# Patient Record
Sex: Male | Born: 1970 | Race: Black or African American | Hispanic: No | Marital: Married | Smoking: Current some day smoker
Health system: Southern US, Community
[De-identification: ages and names within clinical notes are randomized; demographics above are authoritative.]

## PROBLEM LIST (undated history)

## (undated) HISTORY — PX: NO PAST SURGERIES: SHX2092

---

## 2018-04-02 ENCOUNTER — Other Ambulatory Visit: Payer: Self-pay

## 2018-04-02 ENCOUNTER — Ambulatory Visit (INDEPENDENT_AMBULATORY_CARE_PROVIDER_SITE_OTHER): Payer: Medicaid Other | Admitting: Family Medicine

## 2018-04-02 DIAGNOSIS — Z789 Other specified health status: Secondary | ICD-10-CM | POA: Diagnosis not present

## 2018-04-02 DIAGNOSIS — Z0289 Encounter for other administrative examinations: Secondary | ICD-10-CM | POA: Diagnosis present

## 2018-04-02 NOTE — Patient Instructions (Signed)
It was very good to meet you today.  I will get the results from the Health Department.

## 2018-04-04 ENCOUNTER — Encounter: Payer: Self-pay | Admitting: Family Medicine

## 2018-04-04 DIAGNOSIS — Z789 Other specified health status: Secondary | ICD-10-CM | POA: Insufficient documentation

## 2018-04-04 DIAGNOSIS — Z0289 Encounter for other administrative examinations: Secondary | ICD-10-CM | POA: Insufficient documentation

## 2018-04-04 DIAGNOSIS — Z758 Other problems related to medical facilities and other health care: Secondary | ICD-10-CM | POA: Insufficient documentation

## 2018-04-04 NOTE — Assessment & Plan Note (Signed)
Doing well with transition.  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  No complaints or concerns today.  Awaiting lab work from [employer].

## 2018-04-04 NOTE — Progress Notes (Signed)
[place] interpreter Boykin Reaper [phone] utilized during today's visit.  [facility] New [facility] Visit  HPI:  Patient presents to [facility] today for a new patient appointment to establish general primary care.  He has no concerns or complaints.  ROS: The patient denies fever, unusual weight change, decreased hearing, chest pain, palpitations, pre-syncopal or syncopal episodes, dyspnea on exertion, prolonged cough, hemoptysis, change in bowel habits, melena, hematochezia, severe indigestion/heartburn, nausea/vomiting/abdominal pain, genital sores, muscle weakness, difficulty walking, abnormal bleeding, or enlarged lymph nodes.    Past Medical Hx:  -No hospitalizations or chronic medical conditions that he knows of  Past Surgical Hx:  -denies  Family Hx: updated in Epic - Wife died while in [place].  He is here by himself.  Denies any family history of chronic medical conditions.    Immigrant Social History: - Date arrived in [place]: February 27, 2018 - Country of origin: [place] - Location of refugee camp (if applicable), how long there, and what caused patient to leave home country?: *intepreter could not understand what country he spent time in.  Stated he went to [place] (old name for [place]) and then lapsed into [place] when talking about where he went next.   - Primary language: [place] -- though some difficulties with translation  -Requires intepreter (essentially speaks no [place]) - Prior work: Water engineer work - Designer, fashion/clothing use: denies  - Marriage Status: widower  Preventative Care History: -Seen at health department?: yes  PHYSICAL EXAM: BP 124/66   Pulse (!) 58   Temp 98.1 F (36.7 C) (Oral)   Ht 5' 9.5" (1.765 m)   Wt 155 lb (70.3 kg)   SpO2 98%   BMI 22.56 kg/m  Gen:  Alert, cooperative patient who appears stated age in no acute distress.  Vital signs reviewed. Head: Harper/AT.   Eyes:  EOMI, PERRL.   Ears:  External ears WNL, Bilateral TM's normal without  retraction, redness or bulging. Nose:  Septum midline  Mouth:  MMM, tonsils non-erythematous, non-edematous.   Neck: No masses or thyromegaly or limitation in range of motion.  No cervical lymphadenopathy. Cardiac:  Regular rate and rhythm without murmur auscultated.  Good S1/S2. Pulm:  Clear to auscultation bilaterally with good air movement.  No wheezes or rales noted.   Abd:  Soft/nondistended/nontender.  Good bowel sounds throughout all four quadrants.  No masses noted.  Ext:  No clubbing/cyanosis/erythema.  No edema noted bilateral lower extremities.   Neuro:  Alert and oriented to person, place, and date.  No focal deficits noted.   Psych:  Not depressed or anxious appearing.  Linear and coherent thought process as evidenced by speech pattern. Smiles spontaneously.

## 2018-04-04 NOTE — Assessment & Plan Note (Addendum)
[place] intepreter used today.  As he reports being from [place], main language is [place].  He lapsed into [place] multiple times while speaking with [place] interpreter, making interpretation difficult.  He was apparently unaware he was doing this.

## 2018-06-19 ENCOUNTER — Other Ambulatory Visit: Payer: Self-pay

## 2018-06-19 ENCOUNTER — Encounter (HOSPITAL_COMMUNITY): Payer: Self-pay | Admitting: Emergency Medicine

## 2018-06-19 ENCOUNTER — Ambulatory Visit (HOSPITAL_COMMUNITY)
Admission: EM | Admit: 2018-06-19 | Discharge: 2018-06-19 | Disposition: A | Discharge status: Home / Self Care | Payer: Medicaid Other | Attending: Family Medicine | Admitting: Family Medicine

## 2018-06-19 ENCOUNTER — Ambulatory Visit (INDEPENDENT_AMBULATORY_CARE_PROVIDER_SITE_OTHER): Payer: Medicaid Other

## 2018-06-19 DIAGNOSIS — S8251XA Displaced fracture of medial malleolus of right tibia, initial encounter for closed fracture: Secondary | ICD-10-CM | POA: Diagnosis not present

## 2018-06-19 MED ORDER — HYDROCODONE-ACETAMINOPHEN 5-325 MG PO TABS
1.0000 | ORAL_TABLET | Freq: Once | ORAL | Status: AC
  Administered 2018-06-19: 1 via ORAL

## 2018-06-19 MED ORDER — HYDROCODONE-ACETAMINOPHEN 5-325 MG PO TABS
ORAL_TABLET | ORAL | Status: AC
  Filled 2018-06-19: qty 1

## 2018-06-19 MED ORDER — HYDROCODONE-ACETAMINOPHEN 5-325 MG PO TABS: 1.0000 | ORAL_TABLET | ORAL | 0 refills | Status: DC | PRN

## 2018-06-19 NOTE — Progress Notes (Signed)
Orthopedic Tech Progress Note Patient Details:  Linus MakoBenoit Lasky 06/11/1971 [id number]  Ortho Devices Type of Ortho Device: Ace wrap, Post (short leg) splint Ortho Device/Splint Location: rle Ortho Device/Splint Interventions: Application   Post Interventions Patient Tolerated: Well Instructions Provided: Care of device   Nikki DomCrawford, Reegan Bouffard 06/19/2018, 6:57 PM

## 2018-06-19 NOTE — ED Notes (Signed)
Ortho tech notified.  

## 2018-06-19 NOTE — ED Triage Notes (Signed)
Larey SeatFell climbing steps 3 days ago. Right foot is painful and swollen.  Able to move toes, pedal pulse 2 +.  Points to inner ankle as area of pain

## 2018-06-19 NOTE — Discharge Instructions (Addendum)
You have a fracture in your ankle.  We are placing a splint on your leg.  You will need to follow up with orthopedic within the next week.

## 2018-06-20 NOTE — ED Provider Notes (Signed)
[facility]    CSN: [id number] Arrival date & time: 06/19/18  1610     History   Chief Complaint Chief Complaint  Patient presents with  . Fall    HPI Adam MakoBenoit Garrett is a 47 y.o. male.   Pt is a 47 year old male that present with right ankle pain, foot pain and swelling after a fall that occurred 3 days ago. He reports he was going down some steps and stumbled landing on the right leg and foot. Since he has had increased pain and swelling and unable to ambulate on the foot. Reports some numbness and tingling at times.    All of this information was obtained from translator.      History reviewed. No pertinent past medical history.  Patient Active Problem List   Diagnosis Date Noted  . Refugee health examination 04/04/2018  . Language barrier 04/04/2018    History reviewed. No pertinent surgical history.     Home Medications    Prior to Admission medications   Medication Sig Start Date End Date Taking? Authorizing Provider  HYDROcodone-acetaminophen (NORCO/VICODIN) 5-325 MG tablet Take 1 tablet by mouth every 4 (four) hours as needed. 06/19/18   Janace ArisBast, Audrick Lamoureaux A, NP    Family History Family History  Family history unknown: Yes    Social History Social History   Tobacco Use  . Smoking status: Former Games developermoker  . Smokeless tobacco: Never Used  Substance Use Topics  . Alcohol use: Yes  . Drug use: Never     Allergies   Patient has no known allergies.   Review of Systems Review of Systems  Constitutional: Positive for activity change.  Cardiovascular: Negative for leg swelling.  Musculoskeletal: Positive for joint swelling.  Neurological: Negative for numbness.  Hematological: Does not bruise/bleed easily.     Physical Exam Triage Vital Signs ED Triage Vitals  Enc Vitals Group     BP 06/19/18 1708 (!) 151/81     Pulse Rate 06/19/18 1708 70     Resp 06/19/18 1708 18     Temp 06/19/18 1708 98.5 F (36.9 C)     Temp Source 06/19/18  1708 Oral     SpO2 06/19/18 1708 98 %     Weight --      Height --      Head Circumference --      Peak Flow --      Pain Score 06/19/18 1714 8     Pain Loc --      Pain Edu? --      Excl. in GC? --    No data found.  Updated Vital Signs BP (!) 151/81 (BP Location: Left Arm)   Pulse 70   Temp 98.5 F (36.9 C) (Oral)   Resp 18   SpO2 98%   Visual Acuity Right Eye Distance:   Left Eye Distance:   Bilateral Distance:    Right Eye Near:   Left Eye Near:    Bilateral Near:     Physical Exam  Pulmonary/Chest: Effort normal.  Musculoskeletal: Normal range of motion.  Significant swelling and tenderness to the right ankle and foot. 2+ pedal pulse and sensation intact. Most tender around the medial malleolus.   Very limited ROM with ankle. Able to wiggle toes. No weight bearing.   Neurological: He is alert.  Skin: Skin is warm. Capillary refill takes less than 2 seconds.  Psychiatric: He has a normal mood and affect.  Nursing note and vitals reviewed.  UC Treatments / Results  Labs (all labs ordered are listed, but only abnormal results are displayed) Labs Reviewed - No data to display  EKG None  Radiology Dg Ankle Complete Right  Result Date: 06/19/2018 CLINICAL DATA:  Larey Seat while climbing stairs 3 days ago, persistent RIGHT ankle pain and swelling. Initial encounter. EXAM: RIGHT ANKLE - COMPLETE 3+ VIEW COMPARISON:  None. FINDINGS: Acute comminuted mildly displaced fracture involving the MEDIAL malleolus with extension to the articular surface. No other fractures. Ankle mortise intact with well-preserved joint space. Bone mineral density well-preserved. Large joint effusion/hemarthrosis. IMPRESSION: Acute comminuted mildly displaced intra-articular fracture involving the MEDIAL malleolus. Electronically Signed   By: Hulan Saas M.D.   On: 06/19/2018 18:28    Procedures Procedures (including critical care time)  Medications Ordered in UC Medications    HYDROcodone-acetaminophen (NORCO/VICODIN) 5-325 MG per tablet 1 tablet (1 tablet Oral Given 06/19/18 1907)    Initial Impression / Assessment and Plan / UC Course  I have reviewed the triage vital signs and the nursing notes.  Pertinent labs & imaging results that were available during my care of the patient were reviewed by me and considered in my medical decision making (see chart for details).     Pt xray positive for medial malleolus fracture that is mildly displaced. Will place pt in a short leg posterior split with crutches and have him follow up with ortho. Went over this with patient using the translator and he agreed. hydrocodone for pain.  Final Clinical Impressions(s) / UC Diagnoses   Final diagnoses:  Displaced fracture of medial malleolus of right tibia, initial encounter for closed fracture     Discharge Instructions     You have a fracture in your ankle.  We are placing a splint on your leg.  You will need to follow up with orthopedic within the next week.      ED Prescriptions    Medication Sig Dispense Auth. Provider   HYDROcodone-acetaminophen (NORCO/VICODIN) 5-325 MG tablet Take 1 tablet by mouth every 4 (four) hours as needed. 12 tablet Dahlia Byes A, NP     Controlled Substance Prescriptions [place] Controlled Substance Registry consulted? yes   Janace Aris, NP 06/20/18 1014

## 2018-06-26 ENCOUNTER — Ambulatory Visit (INDEPENDENT_AMBULATORY_CARE_PROVIDER_SITE_OTHER): Payer: Medicaid Other | Admitting: Family

## 2018-06-26 ENCOUNTER — Encounter (INDEPENDENT_AMBULATORY_CARE_PROVIDER_SITE_OTHER): Payer: Self-pay | Admitting: Family

## 2018-06-26 VITALS — Ht 69.5 in | Wt 155.0 lb

## 2018-06-26 DIAGNOSIS — S8251XD Displaced fracture of medial malleolus of right tibia, subsequent encounter for closed fracture with routine healing: Secondary | ICD-10-CM

## 2018-06-28 ENCOUNTER — Encounter (INDEPENDENT_AMBULATORY_CARE_PROVIDER_SITE_OTHER): Payer: Self-pay | Admitting: Family

## 2018-06-28 ENCOUNTER — Ambulatory Visit (INDEPENDENT_AMBULATORY_CARE_PROVIDER_SITE_OTHER): Payer: Self-pay | Admitting: Physician Assistant

## 2018-06-28 DIAGNOSIS — S8251XA Displaced fracture of medial malleolus of right tibia, initial encounter for closed fracture: Secondary | ICD-10-CM | POA: Insufficient documentation

## 2018-06-28 NOTE — Progress Notes (Signed)
   Office Visit Note   Patient: Adam Garrett           Date of Birth: 07/29/1971           MRN: [id number] Visit Date: 06/26/2018              Requested by: Tobey GrimWalden, Jeffrey H, MD [address] [place], [place] [address] PCP: Tobey GrimWalden, Jeffrey H, MD  Chief Complaint  Patient presents with  . Right Ankle - Fracture      HPI: The patient is a 47 year old gentleman who presents today for initial evaluation of a right ankle fracture.  Was initially seen in urgent care for the same.  Today is in a splint.  Does not speak English but does have an interpreter accompanying the visit.  Reports through the interpreter he has been weightbearing on the right with his toes.  Complains of minimal pain has not been taking any pain medications.  Fracture initially occurred on 06/17/18.  Presented to ED on 06/19/18.  Outside radiographs were independently reviewed by myself.  These show displaced medial malleolar fracture.  Assessment & Plan: Visit Diagnoses:  1. Closed displaced fracture of medial malleolus of right tibia with routine healing, subsequent encounter     Plan: We will plan for open reduction internal fixation next week.  Patient unable to get a ride or an interpreter for surgery on Wednesday or Friday.  We will plan for surgery on Tuesday.  Discussed this with the interpreter present patient and interpreter in agreement with the plan.  Placed him in a cam walker today.  Encouraged him to use his crutches.  No weightbearing on the right lower extremity.  Elevation for swelling.  Follow-Up Instructions: No follow-ups on file.   Ortho Exam  Patient is alert, oriented, no adenopathy, well-dressed, normal affect, normal respiratory effort. On examination of the right lower extremity.  Moderate swelling.  Distal neurovascular intact.  Imaging: No results found. No images are attached to the encounter.  Labs: No results found for: HGBA1C, ESRSEDRATE, CRP, LABURIC, REPTSTATUS,  GRAMSTAIN, CULT, LABORGA   No results found for: ALBUMIN, PREALBUMIN, LABURIC  Body mass index is 22.56 kg/m.  Orders:  No orders of the defined types were placed in this encounter.  No orders of the defined types were placed in this encounter.    Procedures: No procedures performed  Clinical Data: No additional findings.  ROS:  All other systems negative, except as noted in the HPI. Review of Systems  Constitutional: Negative for chills and fever.  Musculoskeletal: Positive for arthralgias, joint swelling and myalgias.    Objective: Vital Signs: Ht 5' 9.5" (1.765 m)   Wt 155 lb (70.3 kg)   BMI 22.56 kg/m   Specialty Comments:  No specialty comments available.  PMFS History: Patient Active Problem List   Diagnosis Date Noted  . Closed displaced fracture of medial malleolus of tibia with routine healing 06/28/2018  . Refugee health examination 04/04/2018  . Language barrier 04/04/2018   History reviewed. No pertinent past medical history.  Family History  Family history unknown: Yes    History reviewed. No pertinent surgical history. Social History   Occupational History  . Not on file  Tobacco Use  . Smoking status: Former Games developermoker  . Smokeless tobacco: Never Used  Substance and Sexual Activity  . Alcohol use: Yes  . Drug use: Never  . Sexual activity: Not on file

## 2018-07-01 ENCOUNTER — Encounter (HOSPITAL_COMMUNITY): Payer: Self-pay | Admitting: *Deleted

## 2018-07-01 ENCOUNTER — Other Ambulatory Visit: Payer: Self-pay

## 2018-07-01 NOTE — Progress Notes (Signed)
Spoke with pt for pre-op call via [employer] Interpreter, Michael [phone] [place] Pt denies cardiac history, HTN or Diabetes.  Interpreter was requested for day of surgery, there is no one available.

## 2018-07-03 NOTE — Progress Notes (Signed)
LVM with Elnita Maxwellheryl, Surgical Coordinator, to make MD aware that pt stated " I will come for surgery but I have no money, I am here with no wife and [place] worked all month " using [place] interpreter # [phone].

## 2018-07-04 NOTE — Anesthesia Preprocedure Evaluation (Addendum)
Anesthesia Evaluation  Patient identified by MRN, date of birth, ID band Patient awake    Reviewed: Allergy & Precautions, NPO status , Patient's Chart, lab work & pertinent test results  Airway Mallampati: I  TM Distance: >3 FB Neck ROM: Full    Dental no notable dental hx. (+) Missing, Dental Advisory Given,    Pulmonary neg pulmonary ROS, Current Smoker,    Pulmonary exam normal breath sounds clear to auscultation       Cardiovascular negative cardio ROS Normal cardiovascular exam Rhythm:Regular Rate:Normal     Neuro/Psych negative neurological ROS  negative psych ROS   GI/Hepatic negative GI ROS, Neg liver ROS,   Endo/Other  negative endocrine ROS  Renal/GU negative Renal ROS  negative genitourinary   Musculoskeletal negative musculoskeletal ROS (+)   Abdominal   Peds  Hematology negative hematology ROS (+)   Anesthesia Other Findings Right ankle fracture  Reproductive/Obstetrics                            Anesthesia Physical Anesthesia Plan  ASA: I  Anesthesia Plan: General   Post-op Pain Management:  Regional for Post-op pain   Induction: Intravenous  PONV Risk Score and Plan: 1 and Dexamethasone, Ondansetron and Midazolam  Airway Management Planned: LMA  Additional Equipment:   Intra-op Plan:   Post-operative Plan: Extubation in OR  Informed Consent: I have reviewed the patients History and Physical, chart, labs and discussed the procedure including the risks, benefits and alternatives for the proposed anesthesia with the patient or authorized representative who has indicated his/her understanding and acceptance.   Dental advisory given  Plan Discussed with: CRNA  Anesthesia Plan Comments:         Anesthesia Quick Evaluation

## 2018-07-05 ENCOUNTER — Ambulatory Visit (HOSPITAL_COMMUNITY): Payer: Medicaid Other | Admitting: Certified Registered"

## 2018-07-05 ENCOUNTER — Other Ambulatory Visit: Payer: Self-pay

## 2018-07-05 ENCOUNTER — Encounter (HOSPITAL_COMMUNITY)
Admission: RE | Disposition: A | Discharge status: Home / Self Care | Payer: Self-pay | Source: Ambulatory Visit | Attending: Orthopedic Surgery

## 2018-07-05 ENCOUNTER — Ambulatory Visit (HOSPITAL_COMMUNITY)
Admission: RE | Admit: 2018-07-05 | Discharge: 2018-07-05 | Disposition: A | Discharge status: Home / Self Care | Payer: Medicaid Other | Source: Ambulatory Visit | Attending: Orthopedic Surgery | Admitting: Orthopedic Surgery

## 2018-07-05 ENCOUNTER — Encounter (HOSPITAL_COMMUNITY): Payer: Self-pay | Admitting: *Deleted

## 2018-07-05 DIAGNOSIS — S8251XS Displaced fracture of medial malleolus of right tibia, sequela: Secondary | ICD-10-CM

## 2018-07-05 DIAGNOSIS — S8254XA Nondisplaced fracture of medial malleolus of right tibia, initial encounter for closed fracture: Secondary | ICD-10-CM | POA: Insufficient documentation

## 2018-07-05 DIAGNOSIS — S8251XA Displaced fracture of medial malleolus of right tibia, initial encounter for closed fracture: Secondary | ICD-10-CM

## 2018-07-05 DIAGNOSIS — X58XXXA Exposure to other specified factors, initial encounter: Secondary | ICD-10-CM | POA: Insufficient documentation

## 2018-07-05 DIAGNOSIS — F17210 Nicotine dependence, cigarettes, uncomplicated: Secondary | ICD-10-CM | POA: Diagnosis not present

## 2018-07-05 HISTORY — PX: ORIF ANKLE FRACTURE: SHX5408

## 2018-07-05 LAB — CBC
HEMATOCRIT: 49.9 % (ref 39.0–52.0)
HEMOGLOBIN: 16.8 g/dL (ref 13.0–17.0)
MCH: 31.1 pg (ref 26.0–34.0)
MCHC: 33.7 g/dL (ref 30.0–36.0)
MCV: 92.4 fL (ref 78.0–100.0)
Platelets: 251 10*3/uL (ref 150–400)
RBC: 5.4 MIL/uL (ref 4.22–5.81)
RDW: 12.6 % (ref 11.5–15.5)
WBC: 5.1 10*3/uL (ref 4.0–10.5)

## 2018-07-05 SURGERY — OPEN REDUCTION INTERNAL FIXATION (ORIF) ANKLE FRACTURE
Anesthesia: General | Laterality: Right

## 2018-07-05 MED ORDER — MIDAZOLAM HCL 2 MG/2ML IJ SOLN
INTRAMUSCULAR | Status: AC
  Administered 2018-07-05: 1 mg
  Filled 2018-07-05: qty 2

## 2018-07-05 MED ORDER — OXYCODONE-ACETAMINOPHEN 5-325 MG PO TABS
1.0000 | ORAL_TABLET | Freq: Once | ORAL | Status: AC
  Administered 2018-07-05: 1 via ORAL

## 2018-07-05 MED ORDER — FENTANYL CITRATE (PF) 250 MCG/5ML IJ SOLN
INTRAMUSCULAR | Status: AC
  Filled 2018-07-05: qty 5

## 2018-07-05 MED ORDER — FENTANYL CITRATE (PF) 100 MCG/2ML IJ SOLN: 50.0000 ug | Freq: Once | INTRAMUSCULAR | Status: DC

## 2018-07-05 MED ORDER — PROPOFOL 10 MG/ML IV BOLUS
INTRAVENOUS | Status: AC
  Filled 2018-07-05: qty 20

## 2018-07-05 MED ORDER — OXYCODONE-ACETAMINOPHEN 5-325 MG PO TABS
ORAL_TABLET | ORAL | Status: AC
  Filled 2018-07-05: qty 1

## 2018-07-05 MED ORDER — PROPOFOL 10 MG/ML IV BOLUS
INTRAVENOUS | Status: DC | PRN
  Administered 2018-07-05: 150 mg via INTRAVENOUS

## 2018-07-05 MED ORDER — CEFAZOLIN SODIUM-DEXTROSE 2-4 GM/100ML-% IV SOLN
INTRAVENOUS | Status: AC
  Filled 2018-07-05: qty 100

## 2018-07-05 MED ORDER — LACTATED RINGERS IV SOLN
INTRAVENOUS | Status: DC
  Administered 2018-07-05: 09:00:00 via INTRAVENOUS

## 2018-07-05 MED ORDER — HYDROMORPHONE HCL 1 MG/ML IJ SOLN
INTRAMUSCULAR | Status: AC
  Filled 2018-07-05: qty 1

## 2018-07-05 MED ORDER — SODIUM CHLORIDE 0.9 % IR SOLN
Status: DC | PRN
  Administered 2018-07-05: 1000 mL

## 2018-07-05 MED ORDER — HYDROMORPHONE HCL 1 MG/ML IJ SOLN
0.2500 mg | INTRAMUSCULAR | Status: DC | PRN
  Administered 2018-07-05 (×4): 0.5 mg via INTRAVENOUS

## 2018-07-05 MED ORDER — FENTANYL CITRATE (PF) 100 MCG/2ML IJ SOLN
INTRAMUSCULAR | Status: DC | PRN
  Administered 2018-07-05 (×2): 50 ug via INTRAVENOUS

## 2018-07-05 MED ORDER — LACTATED RINGERS IV SOLN
INTRAVENOUS | Status: DC | PRN
  Administered 2018-07-05: 10:00:00 via INTRAVENOUS

## 2018-07-05 MED ORDER — ONDANSETRON HCL 4 MG/2ML IJ SOLN
INTRAMUSCULAR | Status: DC | PRN
  Administered 2018-07-05: 4 mg via INTRAVENOUS

## 2018-07-05 MED ORDER — LIDOCAINE 2% (20 MG/ML) 5 ML SYRINGE
INTRAMUSCULAR | Status: DC | PRN
  Administered 2018-07-05: 100 mg via INTRAVENOUS

## 2018-07-05 MED ORDER — MIDAZOLAM HCL 2 MG/2ML IJ SOLN: 1.0000 mg | Freq: Once | INTRAMUSCULAR | Status: DC

## 2018-07-05 MED ORDER — CEFAZOLIN SODIUM-DEXTROSE 2-4 GM/100ML-% IV SOLN
2.0000 g | INTRAVENOUS | Status: AC
  Administered 2018-07-05: 2 g via INTRAVENOUS

## 2018-07-05 MED ORDER — OXYCODONE-ACETAMINOPHEN 5-325 MG PO TABS: 1.0000 | ORAL_TABLET | ORAL | 0 refills | Status: AC | PRN

## 2018-07-05 MED ORDER — CHLORHEXIDINE GLUCONATE 4 % EX LIQD: 60.0000 mL | Freq: Once | CUTANEOUS | Status: DC

## 2018-07-05 MED ORDER — FENTANYL CITRATE (PF) 100 MCG/2ML IJ SOLN
INTRAMUSCULAR | Status: AC
  Administered 2018-07-05: 50 ug
  Filled 2018-07-05: qty 2

## 2018-07-05 SURGICAL SUPPLY — 34 items
BNDG COHESIVE 6X5 TAN STRL LF (GAUZE/BANDAGES/DRESSINGS) ×3 IMPLANT
BNDG GAUZE ELAST 4 BULKY (GAUZE/BANDAGES/DRESSINGS) ×3 IMPLANT
COVER SURGICAL LIGHT HANDLE (MISCELLANEOUS) ×3 IMPLANT
DRAPE OEC MINIVIEW 54X84 (DRAPES) ×3 IMPLANT
DRAPE U-SHAPE 47X51 STRL (DRAPES) ×3 IMPLANT
DRSG ADAPTIC 3X8 NADH LF (GAUZE/BANDAGES/DRESSINGS) ×3 IMPLANT
DRSG PAD ABDOMINAL 8X10 ST (GAUZE/BANDAGES/DRESSINGS) ×3 IMPLANT
DURAPREP 26ML APPLICATOR (WOUND CARE) ×3 IMPLANT
ELECT REM PT RETURN 9FT ADLT (ELECTROSURGICAL) ×3
ELECTRODE REM PT RTRN 9FT ADLT (ELECTROSURGICAL) ×1 IMPLANT
GAUZE SPONGE 4X4 12PLY STRL LF (GAUZE/BANDAGES/DRESSINGS) ×3 IMPLANT
GLOVE BIOGEL PI IND STRL 9 (GLOVE) ×1 IMPLANT
GLOVE BIOGEL PI INDICATOR 9 (GLOVE) ×2
GLOVE SURG ORTHO 9.0 STRL STRW (GLOVE) ×3 IMPLANT
GOWN STRL REUS W/ TWL XL LVL3 (GOWN DISPOSABLE) ×3 IMPLANT
GOWN STRL REUS W/TWL XL LVL3 (GOWN DISPOSABLE) ×6
GUIDEWIRE THREADED 150MM (WIRE) ×6 IMPLANT
KIT BASIN OR (CUSTOM PROCEDURE TRAY) ×3 IMPLANT
KIT TURNOVER KIT B (KITS) ×3 IMPLANT
MANIFOLD NEPTUNE II (INSTRUMENTS) ×3 IMPLANT
NS IRRIG 1000ML POUR BTL (IV SOLUTION) ×3 IMPLANT
PACK ORTHO EXTREMITY (CUSTOM PROCEDURE TRAY) ×3 IMPLANT
PAD ARMBOARD 7.5X6 YLW CONV (MISCELLANEOUS) ×6 IMPLANT
SCREW CANN S THRD/44 4.0 (Screw) ×6 IMPLANT
STAPLER VISISTAT 35W (STAPLE) IMPLANT
SUCTION FRAZIER HANDLE 10FR (MISCELLANEOUS) ×2
SUCTION TUBE FRAZIER 10FR DISP (MISCELLANEOUS) ×1 IMPLANT
SUT ETHILON 2 0 PSLX (SUTURE) ×3 IMPLANT
SUT VIC AB 2-0 CT1 27 (SUTURE) ×2
SUT VIC AB 2-0 CT1 TAPERPNT 27 (SUTURE) ×1 IMPLANT
TOWEL OR 17X24 6PK STRL BLUE (TOWEL DISPOSABLE) ×3 IMPLANT
TOWEL OR 17X26 10 PK STRL BLUE (TOWEL DISPOSABLE) ×3 IMPLANT
TUBE CONNECTING 12'X1/4 (SUCTIONS) ×1
TUBE CONNECTING 12X1/4 (SUCTIONS) ×2 IMPLANT

## 2018-07-05 NOTE — Op Note (Signed)
07/05/2018  10:49 AM  PATIENT:  Adam Garrett    PRE-OPERATIVE DIAGNOSIS:  right ankle fracture  POST-OPERATIVE DIAGNOSIS:  Same  PROCEDURE:  OPEN REDUCTION INTERNAL FIXATION (ORIF) RIGHT ANKLE FRACTURE C arm fluoroscopy.  SURGEON:  Nadara MustardMarcus V Jisella Ashenfelter, MD  PHYSICIAN ASSISTANT:None ANESTHESIA:   General  PREOPERATIVE INDICATIONS:  Adam Garrett is a  47 y.o. male with a diagnosis of right ankle fracture who failed conservative measures and elected for surgical management.    The risks benefits and alternatives were discussed with the patient preoperatively including but not limited to the risks of infection, bleeding, nerve injury, cardiopulmonary complications, the need for revision surgery, among others, and the patient was willing to proceed.  OPERATIVE IMPLANTS: Synthes 4.0 cannulated screws x2  @ENCIMAGES @  OPERATIVE FINDINGS: C-arm fluoroscopy verified reduction of the mortise.  OPERATIVE PROCEDURE: Patient was brought the operating room and underwent a general anesthetic.  After adequate levels of anesthesia were obtained patient's right lower extremity was prepped using DuraPrep draped in the sterile field a timeout was called.  A medial incision was made over the medial malleolus longitudinally.  This was carried sharply down to bone.  Subperiosteal dissection was used to free the fracture site.  The fracture was reduced and stabilized with K wires.  C-arm fluoroscopy verified alignment of the mortise with open reduction.  This was then secured with two 4 x 44 mm cannulated screws.  C-arm fluoroscopy verified alignment of the fracture.  The wound was irrigated with normal saline incision was closed using 2-0 nylon a sterile dressing was applied patient was extubated taken the PACU in stable condition.   DISCHARGE PLANNING:  Antibiotic duration: Preoperative antibiotics  Weightbearing: Nonweightbearing on the right  Pain medication: Prescription for Percocet  Dressing  care/ Wound VAC: Keep dressing clean dry and intact until follow-up  Ambulatory devices: Crutches  Discharge to: Home  Follow-up: In the office 1 week post operative.

## 2018-07-05 NOTE — Anesthesia Procedure Notes (Signed)
Procedure Name: LMA Insertion Date/Time: 07/05/2018 10:24 AM Performed by: Rudi RummageLowder, Hayk Divis J, CRNA Pre-anesthesia Checklist: Patient identified, Emergency Drugs available, Suction available, Timeout performed and Patient being monitored Patient Re-evaluated:Patient Re-evaluated prior to induction Oxygen Delivery Method: Circle system utilized Preoxygenation: Pre-oxygenation with 100% oxygen Induction Type: IV induction Ventilation: Mask ventilation without difficulty LMA: LMA inserted LMA Size: 4.0 Number of attempts: 1 Placement Confirmation: positive ETCO2 and breath sounds checked- equal and bilateral Tube secured with: Tape Dental Injury: Teeth and Oropharynx as per pre-operative assessment

## 2018-07-05 NOTE — H&P (Signed)
Adam Garrett is an 47 y.o. male.   Chief Complaint: right ankle pain HPI: The patient is a 47 year old gentleman who presents with a right ankle fracture.  Was initially seen in urgent care for the same.  Today is in a splint.  Does not speak English but does have an interpreter accompanying the visit.  Reports through the interpreter he has been weightbearing on the right with his toes.  Complains of minimal pain has not been taking any pain medications.  Fracture initially occurred on 06/17/18.  Presented to ED on 06/19/18.  Outside radiographs were independently reviewed by myself.  These show displaced medial malleolar fracture.   History reviewed. No pertinent past medical history.  Past Surgical History:  Procedure Laterality Date  . NO PAST SURGERIES      Family History  Family history unknown: Yes   Social History:  reports that he has been smoking cigarettes. He has never used smokeless tobacco. He reports that he drinks alcohol. He reports that he does not use drugs.  Allergies: No Known Allergies  No medications prior to admission.    No results found for this or any previous visit (from the past 48 hour(s)). No results found.  Review of Systems  All other systems reviewed and are negative.   There were no vitals taken for this visit. Physical Exam  Patient is alert, oriented, no adenopathy, well-dressed, normal affect, normal respiratory effort. On examination of the right lower extremity.  Moderate swelling.  Distal neurovascular intact. Assessment/Plan 1. Closed displaced fracture of medial malleolus of right tibia    Plan: We will plan for open reduction internal fixation.  Patient unable to get a ride or an interpreter for surgery on Wednesday or Friday.  Discussed this with the interpreter present patient and interpreter in agreement with the plan.  Placed him in a cam walker today.  Encouraged him to use his crutches.  No weightbearing on the right  lower extremity.  Elevation for swelling.    Nadara MustardMarcus V Duda, MD 07/05/2018, 6:46 AM

## 2018-07-05 NOTE — Transfer of Care (Signed)
Immediate Anesthesia Transfer of Care Note  Patient: [name]  Procedure(s) Performed: OPEN REDUCTION INTERNAL FIXATION (ORIF) RIGHT ANKLE FRACTURE (Right )  Patient Location: PACU  Anesthesia Type:General  Level of Consciousness: awake  Airway & Oxygen Therapy: Patient Spontanous Breathing  Post-op Assessment: Report given to RN and Post -op Vital signs reviewed and stable  Post vital signs: Reviewed and stable  Last Vitals:  Vitals Value Taken Time  BP 136/104 07/05/2018 11:05 AM  Temp    Pulse 49 07/05/2018 11:12 AM  Resp 12 07/05/2018 11:12 AM  SpO2 100 % 07/05/2018 11:12 AM  Vitals shown include unvalidated device data.  Last Pain:  Vitals:   07/05/18 0903  TempSrc: Oral  PainSc: 0-No pain      Patients Stated Pain Goal: (" I don't know") (07/05/18 [id number])  Complications: No apparent anesthesia complications

## 2018-07-07 MED ORDER — ROPIVACAINE HCL 5 MG/ML IJ SOLN
INTRAMUSCULAR | Status: DC | PRN
  Administered 2018-07-05 (×2): 20 mL via PERINEURAL

## 2018-07-07 NOTE — Anesthesia Procedure Notes (Addendum)
Anesthesia Regional Block: Adductor canal block   Pre-Anesthetic Checklist: ,, timeout performed, Correct Patient, Correct Site, Correct Laterality, Correct Procedure, Correct Position, site marked, Risks and benefits discussed,  Surgical consent,  Pre-op evaluation,  At surgeon's request and post-op pain management  Laterality: Right  Prep: Maximum Sterile Barrier Precautions used, chloraprep       Needles:  Injection technique: Single-shot  Needle Type: Echogenic Stimulator Needle     Needle Length: 9cm  Needle Gauge: 22     Additional Needles:   Procedures:,,,, ultrasound used (permanent image in chart),,,,  Narrative:  Start time: 07/05/2018 9:49 AM End time: 07/05/2018 9:59 AM Injection made incrementally with aspirations every 5 mL.  Performed by: Personally  Anesthesiologist: Elmer PickerWoodrum, Ahja Martello L, MD  Additional Notes: Monitors applied. No increased pain on injection. No increased resistance to injection. Injection made in 5cc increments. Good needle visualization. Patient tolerated procedure well.

## 2018-07-07 NOTE — Anesthesia Procedure Notes (Addendum)
Anesthesia Regional Block: Popliteal block   Pre-Anesthetic Checklist: ,, timeout performed, Correct Patient, Correct Site, Correct Laterality, Correct Procedure, Correct Position, site marked, Risks and benefits discussed,  Surgical consent,  Pre-op evaluation,  At surgeon's request and post-op pain management  Laterality: Right  Prep: Maximum Sterile Barrier Precautions used, chloraprep       Needles:  Injection technique: Single-shot  Needle Type: Echogenic Stimulator Needle     Needle Length: 9cm  Needle Gauge: 22     Additional Needles:   Procedures:,,,, ultrasound used (permanent image in chart),,,,  Narrative:  Start time: 07/05/2018 10:00 AM End time: 07/05/2018 10:05 AM Injection made incrementally with aspirations every 5 mL.  Performed by: Personally  Anesthesiologist: Elmer PickerWoodrum, Michel Hendon L, MD  Additional Notes: Monitors applied. No increased pain on injection. No increased resistance to injection. Injection made in 5cc increments. Good needle visualization. Patient tolerated procedure well.

## 2018-07-07 NOTE — Anesthesia Postprocedure Evaluation (Signed)
Anesthesia Post Note  Patient: [name]  Procedure(s) Performed: OPEN REDUCTION INTERNAL FIXATION (ORIF) RIGHT ANKLE FRACTURE (Right )     Patient location during evaluation: PACU Anesthesia Type: General Level of consciousness: awake and alert Pain management: pain level controlled Vital Signs Assessment: post-procedure vital signs reviewed and stable Respiratory status: spontaneous breathing, nonlabored ventilation, respiratory function stable and patient connected to nasal cannula oxygen Cardiovascular status: blood pressure returned to baseline and stable Postop Assessment: no apparent nausea or vomiting Anesthetic complications: no    Last Vitals:  Vitals:   07/05/18 1300 07/05/18 1307  BP:  (!) 148/77  Pulse: (!) 59 (!) 54  Resp: 12 12  Temp:  36.4 C  SpO2: 98% 97%    Last Pain:  Vitals:   07/05/18 1105  TempSrc:   PainSc: 0-No pain                 [name] L Lili Harts

## 2018-07-09 ENCOUNTER — Ambulatory Visit (INDEPENDENT_AMBULATORY_CARE_PROVIDER_SITE_OTHER): Payer: Medicaid Other

## 2018-07-09 ENCOUNTER — Encounter (HOSPITAL_COMMUNITY): Payer: Self-pay | Admitting: Orthopedic Surgery

## 2018-07-09 ENCOUNTER — Ambulatory Visit (INDEPENDENT_AMBULATORY_CARE_PROVIDER_SITE_OTHER): Payer: Medicaid Other | Admitting: Orthopedic Surgery

## 2018-07-09 DIAGNOSIS — S8251XS Displaced fracture of medial malleolus of right tibia, sequela: Secondary | ICD-10-CM

## 2018-07-09 DIAGNOSIS — S8251XD Displaced fracture of medial malleolus of right tibia, subsequent encounter for closed fracture with routine healing: Secondary | ICD-10-CM

## 2018-07-09 NOTE — Progress Notes (Signed)
Office Visit Note   Patient: Adam Garrett           Date of Birth: March 01, 1971           MRN: [id number] Visit Date: 07/09/2018              Requested by: Tobey Grim, MD [id number] [place], [place] [address] PCP: Tobey Grim, MD  Chief Complaint  Patient presents with  . Right Ankle - Routine Post Op      HPI: Patient is a 47 year old gentleman who presents 4 days status post open reduction internal fixation displaced medial malleolar fracture.  Patient is on crutches and a fracture boot.  Assessment & Plan: Visit Diagnoses:  1. Closed displaced fracture of medial malleolus of right tibia, sequela   2. Closed displaced fracture of medial malleolus of right tibia with routine healing, subsequent encounter     Plan: Patient will start Dial soap cleansing 4 x 4 gauze dressing changes and change the Ace wrap daily.  Continue with the fracture boot continue nonweightbearing on crutches.  Follow-up in 2 weeks to remove the sutures and repeat radiographs of the right ankle.  Patient is given a note anticipating that he could return to work in 4 weeks.  Follow-Up Instructions: Return in about 2 weeks (around 07/23/2018).   Ortho Exam  Patient is alert, oriented, no adenopathy, well-dressed, normal affect, normal respiratory effort. Examination the incision is well approximated there is no redness no cellulitis no drainage no signs of infection.  He has no pain with passive range of motion of the ankle.  The calf is soft nontender no evidence of DVT.  Imaging: Xr Ankle Complete Right  Result Date: 07/09/2018 3 view radiographs of the right ankle shows a congruent mortise the internal fixation is stable the medial malleolus is well aligned.  No images are attached to the encounter.  Labs: No results found for: HGBA1C, ESRSEDRATE, CRP, LABURIC, REPTSTATUS, GRAMSTAIN, CULT, LABORGA   No results found for: ALBUMIN, PREALBUMIN, LABURIC  There is no height  or weight on file to calculate BMI.  Orders:  Orders Placed This Encounter  Procedures  . XR Ankle Complete Right   No orders of the defined types were placed in this encounter.    Procedures: No procedures performed  Clinical Data: No additional findings.  ROS:  All other systems negative, except as noted in the HPI. Review of Systems  Objective: Vital Signs: There were no vitals taken for this visit.  Specialty Comments:  No specialty comments available.  PMFS History: Patient Active Problem List   Diagnosis Date Noted  . Closed displaced fracture of medial malleolus of right tibia 06/28/2018  . Refugee health examination 04/04/2018  . Language barrier 04/04/2018   History reviewed. No pertinent past medical history.  Family History  Family history unknown: Yes    Past Surgical History:  Procedure Laterality Date  . NO PAST SURGERIES    . ORIF ANKLE FRACTURE Right 07/05/2018   Procedure: OPEN REDUCTION INTERNAL FIXATION (ORIF) RIGHT ANKLE FRACTURE;  Surgeon: Nadara Mustard, MD;  Location: [facility] OR;  Service: Orthopedics;  Laterality: Right;   Social History   Occupational History  . Not on file  Tobacco Use  . Smoking status: Current Some Day Smoker    Packs/day: 0.25    Years: 20.00    Pack years: 5.00    Types: Cigarettes  . Smokeless tobacco: Never Used  Substance and Sexual Activity  .  Alcohol use: Yes    Comment: every two days  . Drug use: Never  . Sexual activity: Not on file

## 2018-07-29 ENCOUNTER — Encounter (INDEPENDENT_AMBULATORY_CARE_PROVIDER_SITE_OTHER): Payer: Self-pay | Admitting: Orthopedic Surgery

## 2018-07-29 ENCOUNTER — Ambulatory Visit (INDEPENDENT_AMBULATORY_CARE_PROVIDER_SITE_OTHER): Payer: Medicaid Other | Admitting: Orthopedic Surgery

## 2018-07-29 VITALS — Ht 69.5 in | Wt 155.0 lb

## 2018-07-29 DIAGNOSIS — S8251XS Displaced fracture of medial malleolus of right tibia, sequela: Secondary | ICD-10-CM

## 2018-07-29 NOTE — Progress Notes (Signed)
   Office Visit Note   Patient: Adam Garrett           Date of Birth: 09/19/1971           MRN: [id number] Visit Date: 07/29/2018              Requested by: Tobey GrimWalden, Jeffrey H, MD [address] [place], [place] [id number] PCP: Tobey GrimWalden, Jeffrey H, MD  Chief Complaint  Patient presents with  . Right Ankle - Routine Post Op      HPI: Patient is a 47 year old gentleman who is 4 weeks status post open reduction internal fixation medial malleolar fracture right ankle.  Patient is ambulating in flip-flops without pain.  Assessment & Plan: Visit Diagnoses:  1. Closed displaced fracture of medial malleolus of right tibia, sequela     Plan: Patient will continue with activities as tolerated no restrictions he will use moisturizing lotion and scar massage to minimize risk of keloiding.  Follow-Up Instructions: Return if symptoms worsen or fail to improve.   Ortho Exam  Patient is alert, oriented, no adenopathy, well-dressed, normal affect, normal respiratory effort. Examination incision is well-healed the sutures are harvested there is no redness no cellulitis no drainage no signs of infection he has no pain with range of motion of the ankle he has no pain with weightbearing in flip-flops.  Imaging: No results found. No images are attached to the encounter.  Labs: No results found for: HGBA1C, ESRSEDRATE, CRP, LABURIC, REPTSTATUS, GRAMSTAIN, CULT, LABORGA   No results found for: ALBUMIN, PREALBUMIN, LABURIC  Body mass index is 22.56 kg/m.  Orders:  No orders of the defined types were placed in this encounter.  No orders of the defined types were placed in this encounter.    Procedures: No procedures performed  Clinical Data: No additional findings.  ROS:  All other systems negative, except as noted in the HPI. Review of Systems  Objective: Vital Signs: Ht 5' 9.5" (1.765 m)   Wt 155 lb (70.3 kg)   BMI 22.56 kg/m   Specialty Comments:  No specialty  comments available.  PMFS History: Patient Active Problem List   Diagnosis Date Noted  . Closed displaced fracture of medial malleolus of right tibia 06/28/2018  . Refugee health examination 04/04/2018  . Language barrier 04/04/2018   History reviewed. No pertinent past medical history.  Family History  Family history unknown: Yes    Past Surgical History:  Procedure Laterality Date  . NO PAST SURGERIES    . ORIF ANKLE FRACTURE Right 07/05/2018   Procedure: OPEN REDUCTION INTERNAL FIXATION (ORIF) RIGHT ANKLE FRACTURE;  Surgeon: Nadara Mustarduda, Abraham Entwistle V, MD;  Location[facility] OR;  Service: Orthopedics;  Laterality: Right;   Social History   Occupational History  . Not on file  Tobacco Use  . Smoking status: Current Some Day Smoker    Packs/day: 0.25    Years: 20.00    Pack years: 5.00    Types: Cigarettes  . Smokeless tobacco: Never Used  Substance and Sexual Activity  . Alcohol use: Yes    Comment: every two days  . Drug use: Never  . Sexual activity: Not on file

## 2019-02-08 IMAGING — DX DG ANKLE COMPLETE 3+V*R*
3 series · 3 of 3 positions shown · non-contrast
Comparison: None.

CLINICAL DATA: Fell while climbing stairs 3 days ago, persistent
RIGHT ankle pain and swelling. Initial encounter.

EXAM:
RIGHT ANKLE - COMPLETE 3+ VIEW

[ankle ap]
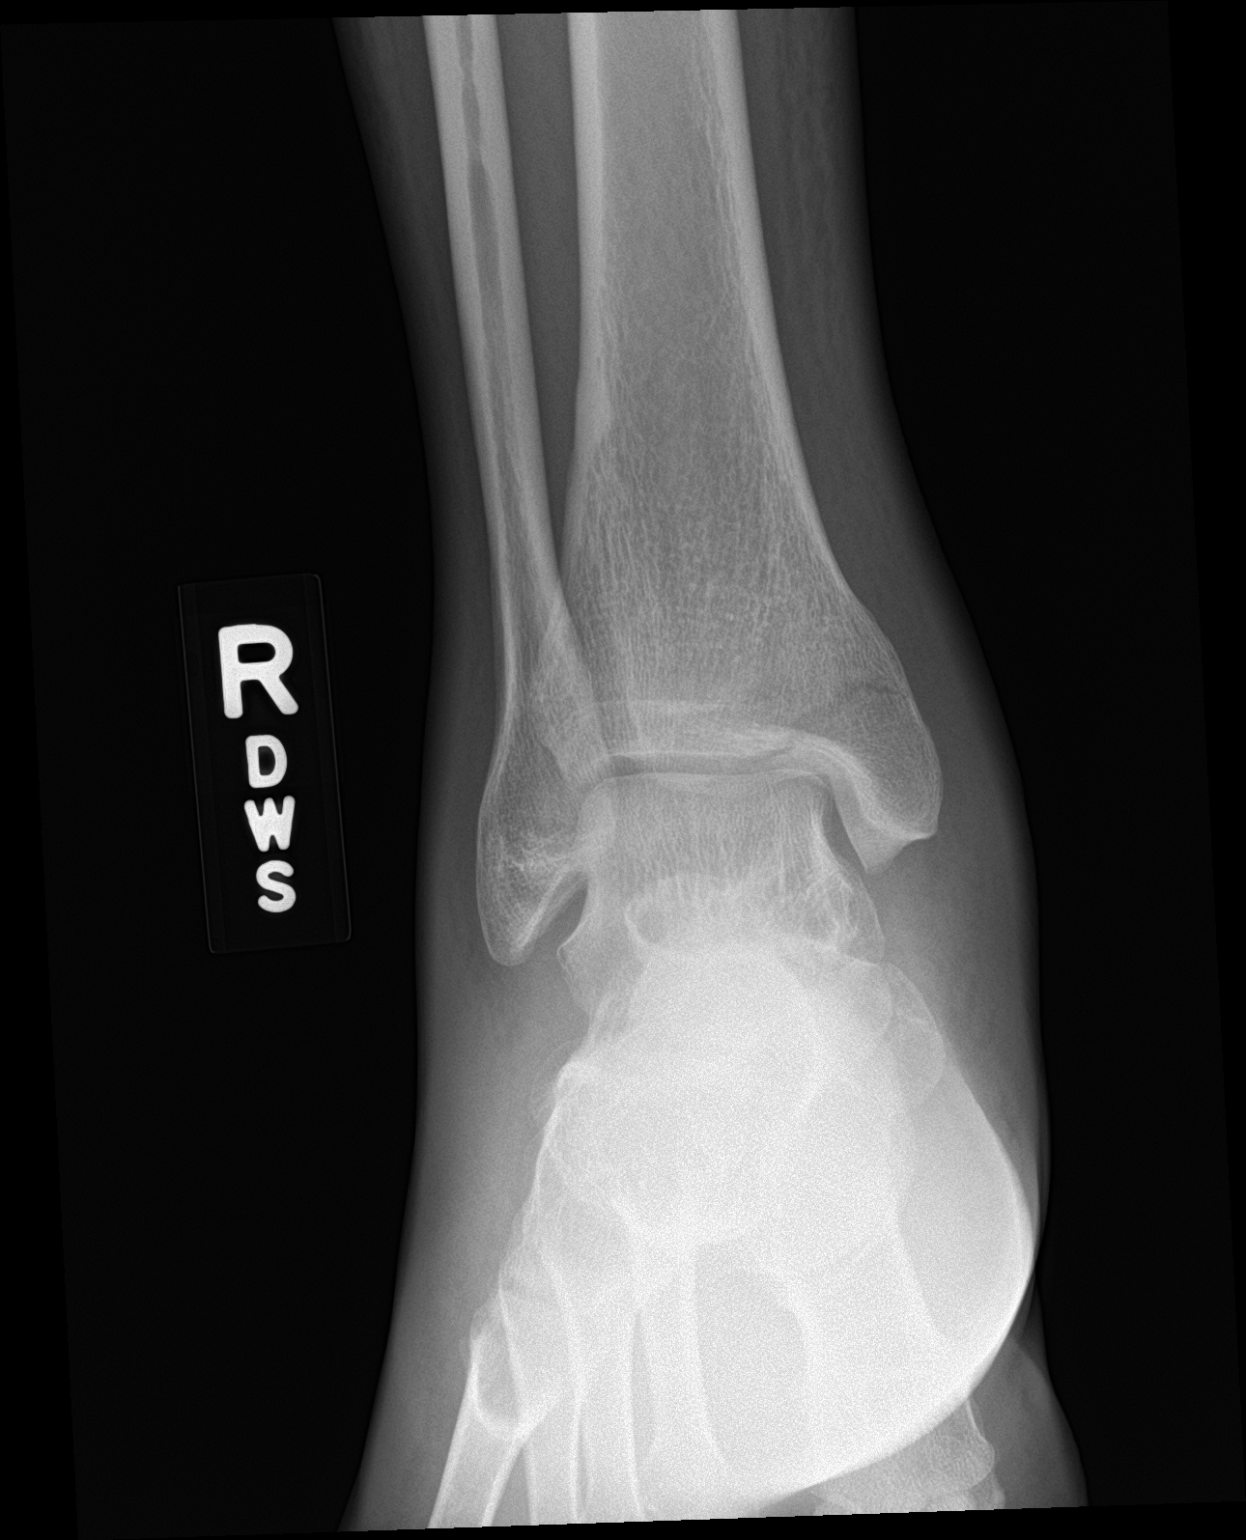

[ankle obl]
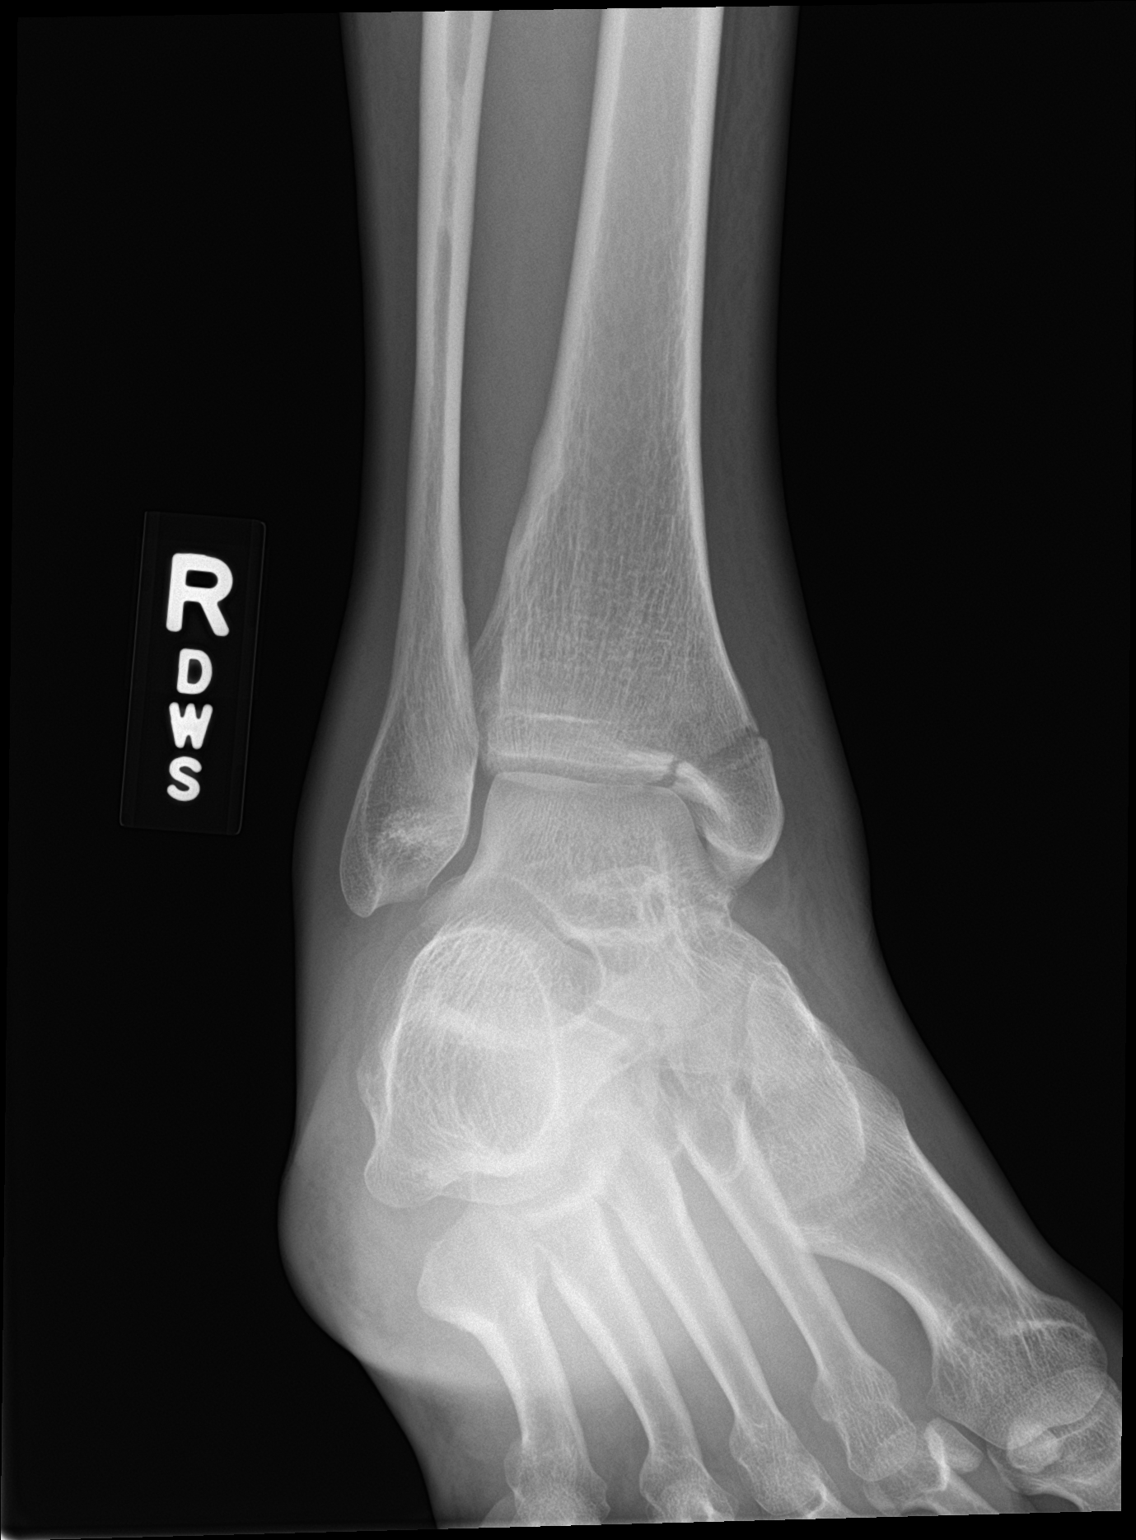

[ankle lat]
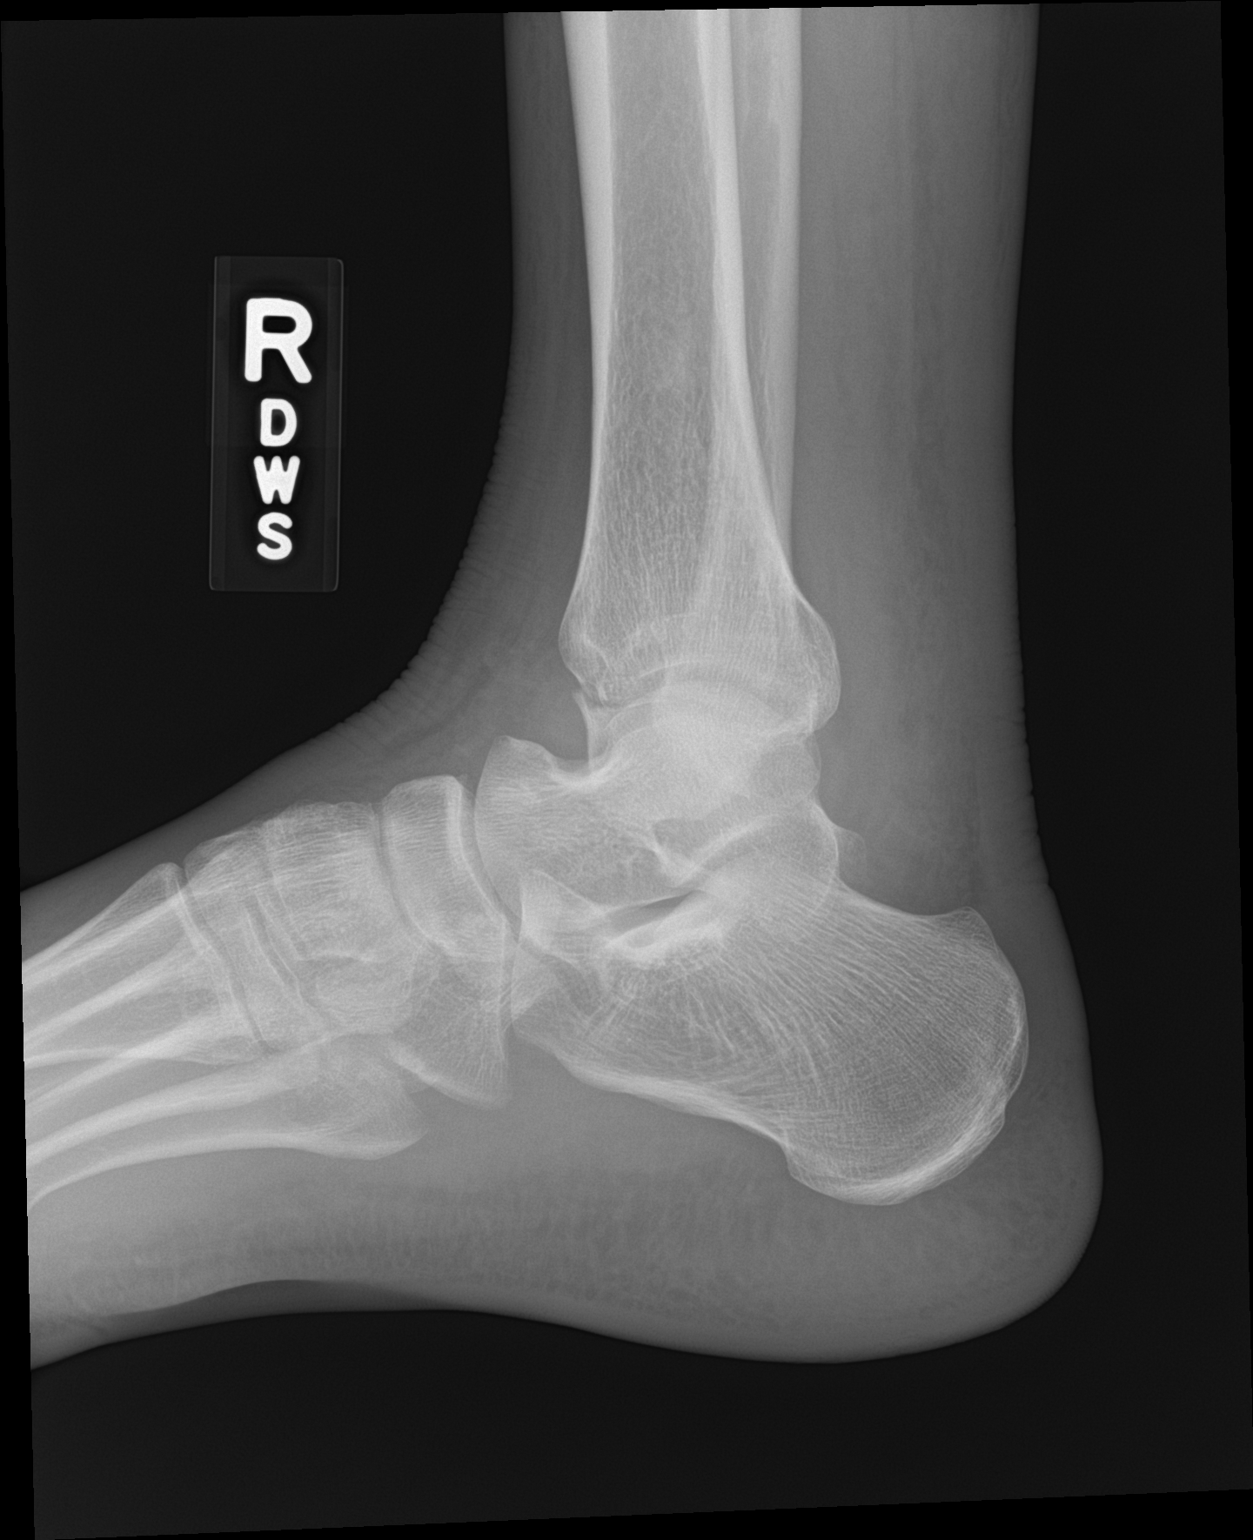

[3 of 3 positions shown; findings below may reference images not displayed]

FINDINGS: Acute comminuted mildly displaced fracture involving the MEDIAL
malleolus with extension to the articular surface. No other
fractures. Ankle mortise intact with well-preserved joint space.
Bone mineral density well-preserved. Large joint
effusion/hemarthrosis.
IMPRESSION: Acute comminuted mildly displaced intra-articular fracture involving
the MEDIAL malleolus.

## 2019-03-25 ENCOUNTER — Ambulatory Visit (INDEPENDENT_AMBULATORY_CARE_PROVIDER_SITE_OTHER): Payer: Self-pay | Admitting: Family Medicine

## 2019-03-25 ENCOUNTER — Encounter: Payer: Self-pay | Admitting: Family Medicine

## 2019-03-25 ENCOUNTER — Other Ambulatory Visit: Payer: Self-pay

## 2019-03-25 VITALS — BP 140/82 | HR 74

## 2019-03-25 DIAGNOSIS — R5383 Other fatigue: Secondary | ICD-10-CM

## 2019-03-25 DIAGNOSIS — J069 Acute upper respiratory infection, unspecified: Secondary | ICD-10-CM | POA: Insufficient documentation

## 2019-03-25 NOTE — Progress Notes (Signed)
     Subjective: Chief Complaint  Patient presents with  . Return to Work visit   In-person interpretor present with patient and used throughout encounter.  HPI: Adam Garrett is a 48 y.o. presenting to clinic today to discuss the following:  1 Letter for Work Patient presents after being told about 1 week and 3 days ago that he should not return to work.  He states that at the time he was not feeling well and thought that he had the flu.  He states that for the past week he has been feeling better.  He denies shortness of breath, fevers, chest pain, cough.  He has a difficult time explaining why he did not feel well, but states that he is feeling much better now.  Possibly had some fatigue at onset of symptoms, but now resolved.  He works at [employer] and is requesting a note to return to work.     ROS noted in HPI.   Past Medical, Surgical, Social, and Family History Reviewed & Updated per EMR.   Pertinent Historical Findings include:   Social History   Tobacco Use  Smoking Status Current Some Day Smoker  . Packs/day: 0.25  . Years: 20.00  . Pack years: 5.00  . Types: Cigarettes  Smokeless Tobacco Never Used      Objective: BP 140/82   Pulse 74   SpO2 99%  Vitals and nursing notes reviewed  Physical Exam:  General: 48 y.o. male in NAD HEENT: supple, no LAD Cardio: RRR no m/r/g Lungs: CTAB, no wheezing, no rhonchi, no crackles, no IWOB on RA Abdomen: Soft, non-tender to palpation, non-distended, positive bowel sounds Skin: warm and dry Extremities: No edema   No results found for this or any previous visit (from the past 72 hour(s)).  Assessment/Plan:  Fatigue Likely viral in origin.  Very unclear as is difficult to obtain history despite use of interpretor.  Patient notes he has not had a fever, cough, or shortness of breath in the last week and that he feels "normal."  Patient given a work explaining this.    Patient encouraged to make another  appointment in the next few months with PCP     PATIENT EDUCATION PROVIDED: See AVS    Diagnosis and plan along with any newly prescribed medication(s) were discussed in detail with this patient today. The patient verbalized understanding and agreed with the plan. Patient advised if symptoms worsen return to clinic or ER.     No orders of the defined types were placed in this encounter.   No orders of the defined types were placed in this encounter.    Luis Abed, DO 03/25/2019, 11:05 AM PGY-1 [facility]

## 2019-03-25 NOTE — Assessment & Plan Note (Signed)
Likely viral in origin.  Very unclear as is difficult to obtain history despite use of interpretor.  Patient notes he has not had a fever, cough, or shortness of breath in the last week and that he feels "normal."  Patient given a work explaining this.    Patient encouraged to make another appointment in the next few months with PCP

## 2019-03-25 NOTE — Patient Instructions (Signed)
Thank you for coming to see me today. It was a pleasure. Today we talked about:   Your symptoms.  I'm glad you are feeling better.  I have given you a letter for work.  If you have any questions or concerns, please do not hesitate to call the office at [phone].  Best,   Luis Abed, DO

## 2019-04-18 ENCOUNTER — Other Ambulatory Visit: Payer: Self-pay

## 2019-04-18 ENCOUNTER — Telehealth: Payer: Self-pay | Admitting: Family Medicine

## 2019-04-18 DIAGNOSIS — Z20828 Contact with and (suspected) exposure to other viral communicable diseases: Secondary | ICD-10-CM

## 2019-04-18 DIAGNOSIS — Z20822 Contact with and (suspected) exposure to covid-19: Secondary | ICD-10-CM

## 2019-04-18 NOTE — Progress Notes (Signed)
Attempted to call patient at number provided using [place] interpreter.  Unable to get in touch x2. Will have to reschedule telemedicine encounter.

## 2019-04-29 ENCOUNTER — Telehealth: Payer: Self-pay | Admitting: Family Medicine

## 2019-04-29 NOTE — Telephone Encounter (Signed)
Claim for income protection benefits form dropped off for at front desk for completion.  Verified that patient section of form has been completed.  Last DOS/WCC with PCP was 03/25/19.  Placed form in team folder to be completed by clinical staff.  Crista Luria

## 2019-04-29 NOTE — Telephone Encounter (Signed)
Reviewed form and placed in PCP's box for completion.  .Michelle R Simpson, CMA  

## 2019-05-01 NOTE — Telephone Encounter (Signed)
Reviewed form--- this form is for the patient only. Please call the patient with a [place] interpreter and find out if he needs specific paperwork completed.  If he needs paperwork for his job, please schedule in immigrant/refugee clinic next week with me. (Tuesday PM).  Dorris Singh, MD  Family Medicine Teaching Service

## 2019-05-05 NOTE — Telephone Encounter (Signed)
Attempted to call with interpretor.  No answer and no machine.  Will place in red folder until pt calls back. Christen Bame, CMA

## 2019-05-21 NOTE — Telephone Encounter (Signed)
Attempted again.   Same response.  Will again put this in red team folder until next appt or callback. Christen Bame, CMA

## 2020-09-14 ENCOUNTER — Ambulatory Visit: Payer: Self-pay | Attending: Critical Care Medicine

## 2020-09-14 ENCOUNTER — Ambulatory Visit: Payer: Self-pay | Admitting: *Deleted

## 2020-09-14 DIAGNOSIS — Z23 Encounter for immunization: Secondary | ICD-10-CM

## 2020-09-14 NOTE — Progress Notes (Signed)
Patient presented for 1st dose. Patient observed with no concerns.

## 2020-09-14 NOTE — Progress Notes (Signed)
   Covid-19 Vaccination Clinic  Name:  Adam Garrett    MRN: [id number] DOB: Jul 04, 1971  09/14/2020  [name] was observed post Covid-19 immunization for 15 minutes without incident. He was provided with Vaccine Information Sheet and instruction to access the V-Safe system.   [name] was instructed to call 911 with any severe reactions post vaccine: Marland Kitchen Difficulty breathing  . Swelling of face and throat  . A fast heartbeat  . A bad rash all over body  . Dizziness and weakness   Immunizations Administered    Name Date Dose VIS Date Route   Moderna COVID-19 Vaccine 09/14/2020 11:30 AM 0.5 mL 09/01/2020 Intramuscular   Manufacturer: Moderna   Lot: [id number]   NDC: 37482-707-86

## 2020-11-02 ENCOUNTER — Ambulatory Visit: Payer: Self-pay | Attending: Critical Care Medicine

## 2020-11-02 ENCOUNTER — Ambulatory Visit: Payer: Self-pay

## 2020-11-02 DIAGNOSIS — Z23 Encounter for immunization: Secondary | ICD-10-CM

## 2020-11-02 NOTE — Progress Notes (Signed)
COVID vaccine, Moderna administered to L deltiod, pt tolerated well w/o adverse reaction noted, VIS given, pt waited 15 mins w/o reaction
